# Patient Record
Sex: Female | Born: 1995 | Race: White | Hispanic: Yes | Marital: Single | Smoking: Never smoker
Health system: Southern US, Community
[De-identification: ages and names within clinical notes are randomized; demographics above are authoritative.]

---
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
## 2013-12-04 ENCOUNTER — Ambulatory Visit: Payer: Self-pay | Admitting: Family Medicine

## 2013-12-12 ENCOUNTER — Ambulatory Visit: Payer: Self-pay

## 2013-12-12 ENCOUNTER — Encounter: Payer: Self-pay | Admitting: Podiatry

## 2013-12-12 ENCOUNTER — Ambulatory Visit (INDEPENDENT_AMBULATORY_CARE_PROVIDER_SITE_OTHER): Payer: 59 | Admitting: Podiatry

## 2013-12-12 VITALS — BP 120/80 | HR 66 | Resp 16 | Ht 63.0 in | Wt 115.0 lb

## 2013-12-12 DIAGNOSIS — G5781 Other specified mononeuropathies of right lower limb: Secondary | ICD-10-CM

## 2013-12-12 DIAGNOSIS — M2041 Other hammer toe(s) (acquired), right foot: Secondary | ICD-10-CM

## 2013-12-12 DIAGNOSIS — G5761 Lesion of plantar nerve, right lower limb: Secondary | ICD-10-CM

## 2013-12-12 NOTE — Progress Notes (Signed)
Subjective:     Patient ID: Ann Jacobs, female   DOB: 1995-05-22, 18 y.o.   MRN: [id number]  HPI patient presents stating that she gets shooting pain in the second toe of her right foot and third toe and she's not sure what causes it but she does have a history of cerebral palsy and has had numerous foot surgeries when she was young   Review of Systems  All other systems reviewed and are negative.      Objective:   Physical Exam  Constitutional: She is oriented to person, place, and time.  Cardiovascular: Intact distal pulses.   Musculoskeletal: Normal range of motion.  Neurological: She is oriented to person, place, and time.  Skin: Skin is warm.  Nursing note and vitals reviewed.  neurovascular status intact with fixed equinus noted bilateral and no range of motion subtalar or midtarsal joint right over left. Patient's noted to have diminished muscle strength and does use braces and basically walks on the forefeet bilateral secondary to her cerebral palsy. Patient has discomfort in the right forefoot which is difficult to elicit but appears to come mostly from the second interspace and shoot to the adjacent digit     Assessment:     Difficult to make complete decision here as she does have multi-issues secondary to history of cerebral palsy and fixed equinus. Does seem to be experiencing some kind of nerve compression    Plan:     Reviewed condition with patient and today I tried a neuro lysis injection second interspace insisting of 1.3 mL of purified alcohol mixed with Marcaine. I explained to her the procedure and at this does seem to make a difference it may take a series of these and we may be able to stop nerve compression from occurring. This may or may not be helpful to her and we will reevaluate again in 2 weeks

## 2013-12-12 NOTE — Progress Notes (Signed)
   Subjective:    Patient ID: Ann Jacobs, female    DOB: Feb 20, 1995, 18 y.o.   MRN: [id number]  HPI Comments: Have nerve pain in the 3 rd toe right foot, has been going on for 1 year. Had two xrays and nothing shows on it    Foot Pain      Review of Systems  All other systems reviewed and are negative.      Objective:   Physical Exam        Assessment & Plan:

## 2013-12-28 ENCOUNTER — Ambulatory Visit (INDEPENDENT_AMBULATORY_CARE_PROVIDER_SITE_OTHER): Payer: 59 | Admitting: Podiatry

## 2013-12-28 VITALS — BP 96/57 | HR 82 | Resp 16

## 2013-12-28 DIAGNOSIS — G5761 Lesion of plantar nerve, right lower limb: Secondary | ICD-10-CM

## 2013-12-28 DIAGNOSIS — G5781 Other specified mononeuropathies of right lower limb: Secondary | ICD-10-CM

## 2013-12-28 NOTE — Progress Notes (Signed)
Patient ID: Ann ButtersClara Jacobs, female   DOB: 1995/06/27, 18 y.o.   MRN: [id number]  Subjective: 18 year old female returns the office today for injection into the right foot. The patient previously has had an alcohol sclerosing injection into the second interspace due to likely neuroma. She states after the injection she did have some relief of symptoms however she does continue to have some pain to the area. She tolerated the previous injection well without any complications. She denies any overlying edema, erythema, increase in warmth. She denies any recent injury or trauma to the area. She does not want any x-rays taken of her foot today.   Patient states that the pain has been ongoing for approximately 1 year. She relates a history of burning/tingling to the 2nd/3rd digits.   No other complaints at this time in no acute changes since last appointment.  Objective: AAO 3, NAD DP/PT pulses palpable bilaterally, CRT less than 3 seconds On the right there is reproduction of symptoms upon compression within the second interspace between the metatarsal heads. There is mild pain/numbness upon compression. There is no overlying edema, erythema, increase in warmth. There is no areas of pinpoint bony tenderness or pain with vibratory sensation. There is a decrease in range of motion of the subtalar and midtarsal joints. Manual muscle testing is decreased particularly in plantar flexion and eversion. No open lesions or pre-ulcerative lesions. No pain with calf compression, swelling, warmth, erythema.  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 -Recommended x-rays the patient however she refused. -At this time the patient does have symptoms consistent with a neuroma.  I discussed with the patient another sclerosing alcohol injection including all risks, complications of the injection. She  wishes to proceed with the injection at this time. The patient was infiltrated with a total of 1.2 mL of purified alcohol mixture with Marcaine under sterile conditions into the right foot second interspace. A Band-Aid was applied. Patient tolerated the injection well without any complications. Discussed post-injection care with the patient. -Dispensed neuroma pad. -Recommend possible MRI. She is elected to hold off on the MRI at this time and to proceed with the injection. If she does not get any relief from the injection she will proceed with the MRI.

## 2013-12-29 ENCOUNTER — Ambulatory Visit: Payer: 59 | Admitting: Podiatry

## 2014-02-13 ENCOUNTER — Ambulatory Visit (INDEPENDENT_AMBULATORY_CARE_PROVIDER_SITE_OTHER): Payer: 59 | Admitting: Podiatry

## 2014-02-13 VITALS — BP 109/65 | HR 87 | Resp 16

## 2014-02-13 DIAGNOSIS — G5761 Lesion of plantar nerve, right lower limb: Secondary | ICD-10-CM

## 2014-02-13 DIAGNOSIS — G5781 Other specified mononeuropathies of right lower limb: Secondary | ICD-10-CM

## 2014-02-13 NOTE — Patient Instructions (Addendum)
Hold off on wearing pads between your toes as the skin was getting to wet.   You previously had dehydrated alcohol injections for neuroma.   Follow-up after MRI

## 2014-02-14 ENCOUNTER — Telehealth: Payer: Self-pay | Admitting: *Deleted

## 2014-02-14 NOTE — Telephone Encounter (Signed)
Called left message for pt to call back regarding mri appt 2.5.16 arrival time 9:15 for a 9:45 appt at [id number]. After pass [facility] on left, mri will be on left. Also [facility] is needing to know when last menstrual cycle was due to giving pt contrast. If can not get info they will get a pregnancy test before mri.

## 2014-02-14 NOTE — Progress Notes (Signed)
Patient ID: Ann ButtersClara Binns, female   DOB: 06/05/1995, 19 y.o.   MRN: [id number]  19 year old female presents the office today for continued evaluation of right foot pain. She states that the injections she had to the area has not helped relieve her symptoms she continues to have pain in between the second and third toes. She denies any recent injury or trauma to the area as well as any significant swelling, redness to the area. She has been wearing offloading pads to all of her foot as well as silicone pads in between her toes. At this time she is requesting MRI. No other complaints at this time in no acute changes since last appointment. Denies any systemic complaints such as fevers, chills, nausea, vomiting.  Objective: AAO 3, NAD DP/PT pulses palpable, CRT less than 3 seconds Neurological status unchanged. There is again slight reproduction of symptoms upon palpation in the second interspace between the second and third metatarsal heads resulting in numbness and tingling to the digits. There is no definitive neuroma palpable. There is no overlying edema, erythema, increased warmth. There is no areas of pinpoint bony tenderness or pain the vibratory sensation along the metatarsals or digits or other areas of the foot. There is no pain with MTPJ range of motion. There is slight maceration between the second/ third and the third/fourth toes likely from where the silicone toe separators. No other open lesions or pre-ulcer lesions identified. No pain with calf compression, swelling, warmth,  erythema.  Assessment: 19 year old female with right second interspace, likely neuroma  Plan: -Treatment options were discussed the patient cleaned alternatives, risks, complications. -At this time the patient is requesting MRI and she's continued to have pain to the area for greater than 1 year without resolution. An MRI was ordered. -Continue with the offloading pads as needed. However discussed with her that she  is developing macerated tissue between the toes and slight redness from irritation to hold off on wearing the silicone pads and to monitor the area to prevent any skin breakdown. -Follow-up after MRI or sooner should any problems arise. In the meantime, encouraged call the office with any questions, concerns, change in symptoms.

## 2014-02-14 NOTE — Telephone Encounter (Signed)
Called [employer] at [phone] spoke with michelle c. Stated no precert required for mri. Ref # [id number]

## 2014-03-20 ENCOUNTER — Ambulatory Visit: Payer: Self-pay | Admitting: Family Medicine

## 2014-03-20 ENCOUNTER — Ambulatory Visit: Payer: Self-pay | Admitting: Podiatry

## 2014-03-22 ENCOUNTER — Telehealth: Payer: Self-pay | Admitting: *Deleted

## 2014-03-22 ENCOUNTER — Encounter: Payer: Self-pay | Admitting: Podiatry

## 2014-03-22 NOTE — Telephone Encounter (Signed)
Called and left message for patient regarding her mri results are back . May want to consider physical therapy

## 2016-03-17 IMAGING — MR MRI OF THE RIGHT FOREFOOT WITHOUT AND WITH CONTRAST
8 series · 40 of 40 positions shown · IV contrast (multihance)
Comparison: Radiographs 12/04/2013.

CLINICAL DATA: Pain in the right third toe with weight-bearing for
1 year. History of multiple surgeries in both feet. No known injury.
Evaluate for neuroma. Initial encounter.

EXAM:
MRI OF THE RIGHT FOREFOOT WITHOUT AND WITH CONTRAST
TECHNIQUE: Multiplanar, multisequence MR imaging was performed both before and
after administration of intravenous contrast.
CONTRAST:  10 ml MultiHance.

[Series 3: T1 · coronal · 3.0mm · 0.53mm/px · 7 of 40 slices shown (1 of 2)]
[im 1/40]
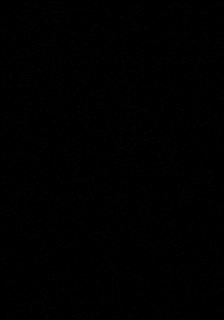
[im 7/40]
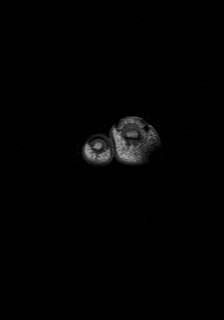
[im 14/40]
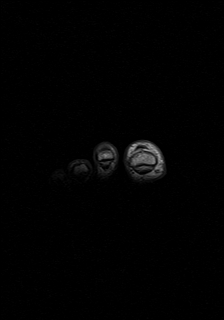
[im 20/40]
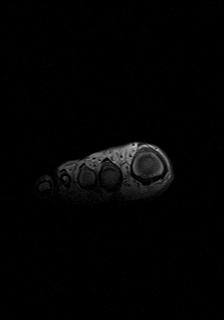
[im 27/40]
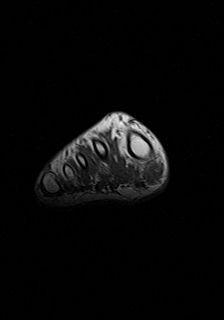
[im 33/40]
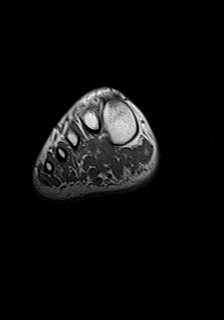
[im 40/40]
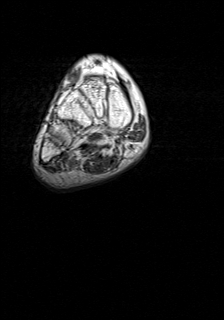

[Series 4: axial t1fs (do · coronal · 3.0mm · 0.53mm/px · 6 of 40 slices shown]
[im 1/40]
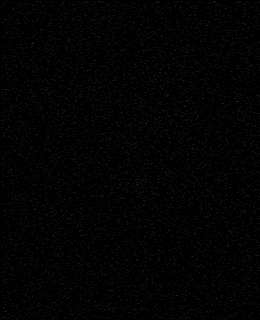
[im 8/40]
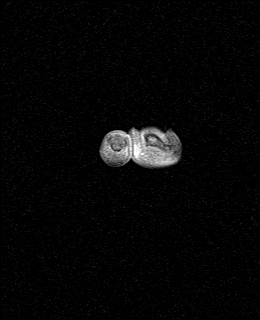
[im 16/40]
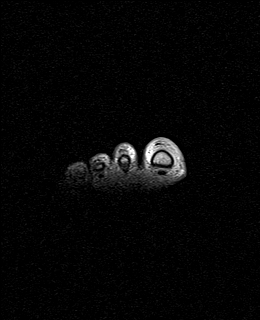
[im 24/40]
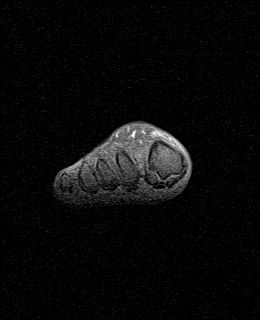
[im 32/40]
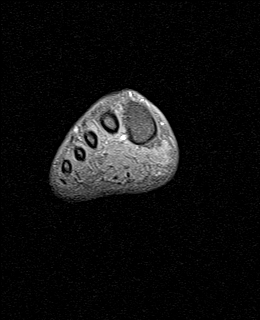
[im 40/40]
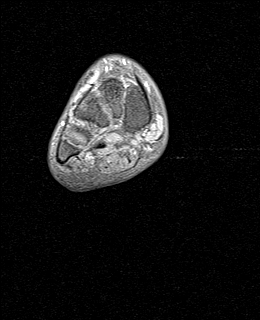

[Series 5: T2 fat-sat · coronal · 3.0mm · 0.53mm/px · 6 of 40 slices shown (1 of 2)]
[im 1/40]
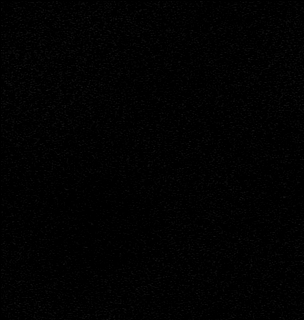
[im 8/40]
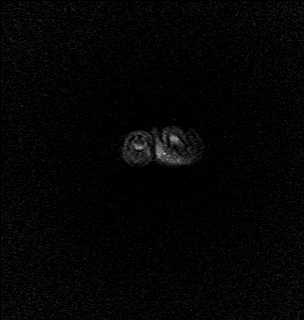
[im 16/40]
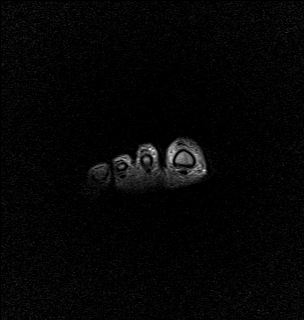
[im 24/40]
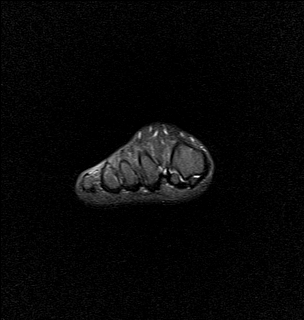
[im 32/40]
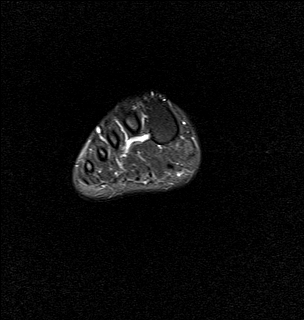
[im 40/40]
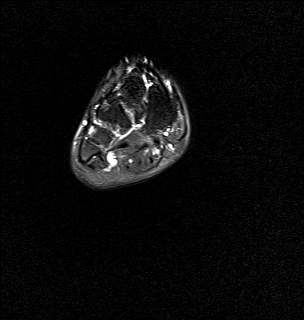

[Series 6: T1 · axial · 3.0mm · 0.47mm/px · z∈[-78,-7]mm · 3 of 23 slices shown (2 of 2)]
[im 1/23]
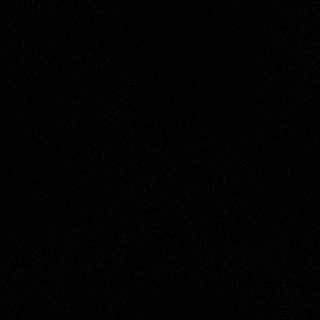
[im 12/23]
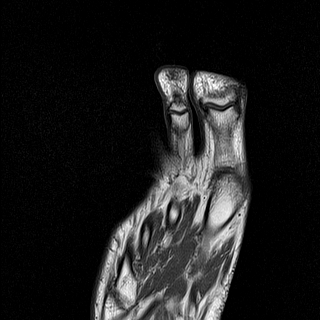
[im 23/23]
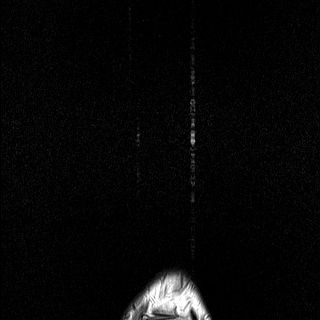

[Series 7: T2 fat-sat · axial · 3.0mm · 0.59mm/px · z∈[-78,-6]mm · 3 of 23 slices shown (2 of 2)]
[im 1/23]
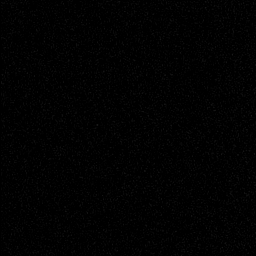
[im 12/23]
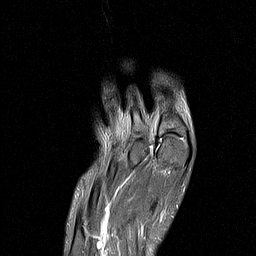
[im 23/23]
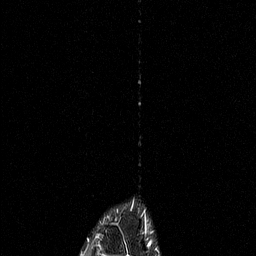

[Series 8: axial t1w/fatsat post · coronal · 3.0mm · 0.53mm/px · 6 of 40 slices shown]
[im 1/40]
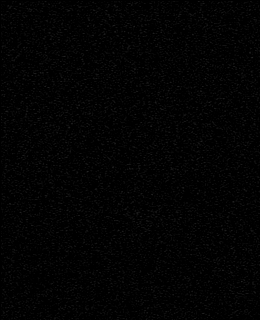
[im 8/40]
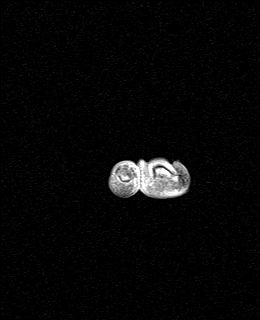
[im 16/40]
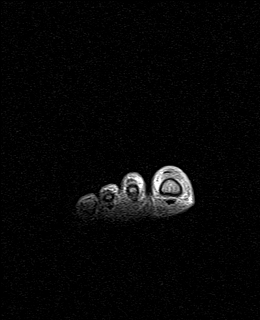
[im 24/40]
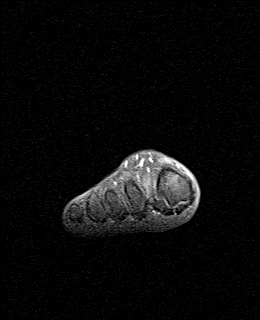
[im 32/40]
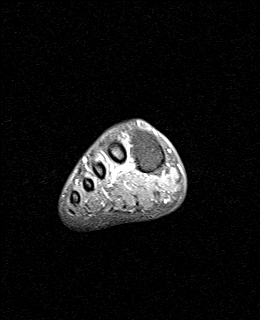
[im 40/40]
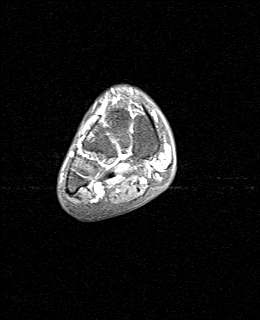

[Series 101: T1 fat-sat · coronal · 3.0mm · 0.53mm/px · 6 of 40 slices shown (1 of 2)]
[im 1/40]
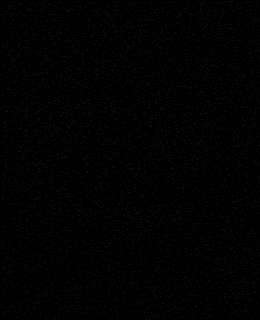
[im 8/40]
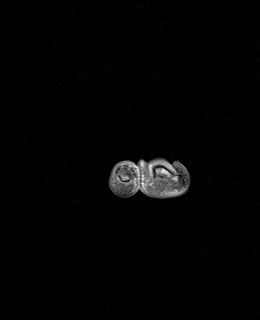
[im 16/40]
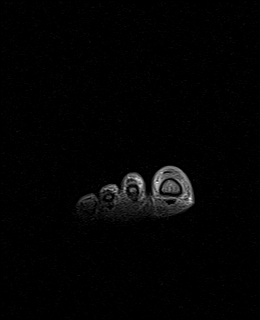
[im 24/40]
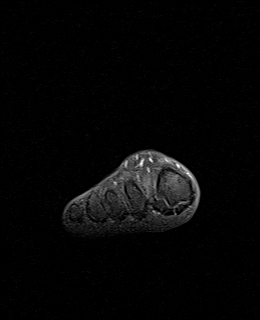
[im 32/40]
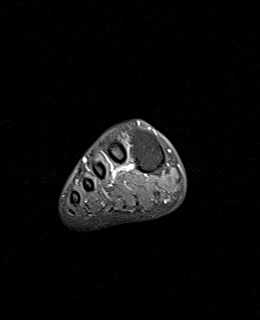
[im 40/40]
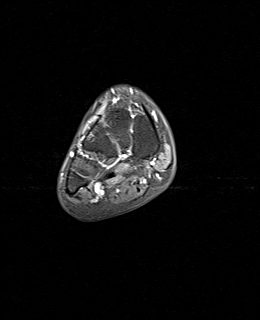

[Series 103: T1 fat-sat · axial · 3.0mm · 0.59mm/px · z∈[-78,-7]mm · 3 of 23 slices shown (2 of 2)]
[im 1/23]
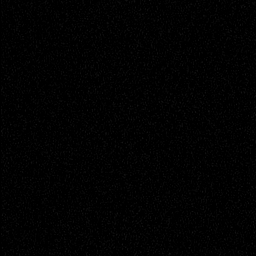
[im 12/23]
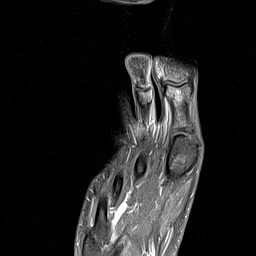
[im 23/23]
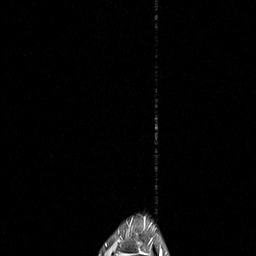

[40 of 40 positions shown; findings below may reference images not displayed]

FINDINGS: There is no evidence of soft tissue mass, fluid collection or
abnormal enhancement. Specifically, no web space lesions are
identified to suggest the presence of a Morton's neuroma. The flexor
and extensor tendons appear normal.

The metatarsal heads, proximal phalanges and metatarsal phalangeal
joints appear normal. There is marrow T2 hyperintensity and
enhancement within the distal phalanx of the great toe. There is
questionable lesser involvement within the head of the proximal
first phalanx, although this may be secondary to incomplete fat
saturation. No cortical fracture or bone destruction identified.
IMPRESSION: 1. No evidence of Morton's neuroma.
2. Marrow T2 hyperintensity and enhancement within the distal
phalanx of the great toe. This finding is highly nonspecific and may
be secondary to the patient's reported prior surgeries, trauma or
inflammation. Clinical and plain film correlation recommended.
